# Patient Record
Sex: Female | Born: 1985 | Race: Black or African American | Hispanic: No | Marital: Single | Smoking: Former smoker
Health system: Southern US, Community
[De-identification: ages and names within clinical notes are randomized; demographics above are authoritative.]

---

## 2009-12-17 HISTORY — PX: TUBAL LIGATION: SHX77

## 2017-02-05 ENCOUNTER — Emergency Department (HOSPITAL_BASED_OUTPATIENT_CLINIC_OR_DEPARTMENT_OTHER): Payer: Self-pay

## 2017-02-05 ENCOUNTER — Emergency Department (HOSPITAL_BASED_OUTPATIENT_CLINIC_OR_DEPARTMENT_OTHER)
Admission: EM | Admit: 2017-02-05 | Discharge: 2017-02-05 | Disposition: A | Discharge status: Home / Self Care | Payer: Self-pay | Attending: Emergency Medicine | Admitting: Emergency Medicine

## 2017-02-05 ENCOUNTER — Encounter (HOSPITAL_BASED_OUTPATIENT_CLINIC_OR_DEPARTMENT_OTHER): Payer: Self-pay | Admitting: *Deleted

## 2017-02-05 DIAGNOSIS — R0789 Other chest pain: Secondary | ICD-10-CM | POA: Insufficient documentation

## 2017-02-05 DIAGNOSIS — R5383 Other fatigue: Secondary | ICD-10-CM | POA: Insufficient documentation

## 2017-02-05 DIAGNOSIS — R079 Chest pain, unspecified: Secondary | ICD-10-CM

## 2017-02-05 LAB — BASIC METABOLIC PANEL
ANION GAP: 5 (ref 5–15)
BUN: 7 mg/dL (ref 6–20)
CHLORIDE: 108 mmol/L (ref 101–111)
CO2: 25 mmol/L (ref 22–32)
Calcium: 8.5 mg/dL — ABNORMAL LOW (ref 8.9–10.3)
Creatinine, Ser: 0.71 mg/dL (ref 0.44–1.00)
GFR calc non Af Amer: >60 mL/min (ref >60)
Glucose, Bld: 76 mg/dL (ref 65–99)
POTASSIUM: 4.3 mmol/L (ref 3.5–5.1)
SODIUM: 138 mmol/L (ref 135–145)

## 2017-02-05 LAB — CBC WITH DIFFERENTIAL/PLATELET
BASOS PCT: 1 %
Basophils Absolute: 0 10*3/uL (ref 0.0–0.1)
EOS ABS: 0.3 10*3/uL (ref 0.0–0.7)
Eosinophils Relative: 9 %
HCT: 36.6 % (ref 36.0–46.0)
Hemoglobin: 12.4 g/dL (ref 12.0–15.0)
Lymphocytes Relative: 30 %
Lymphs Abs: 1.1 10*3/uL (ref 0.7–4.0)
MCH: 29.7 pg (ref 26.0–34.0)
MCHC: 33.9 g/dL (ref 30.0–36.0)
MCV: 87.6 fL (ref 78.0–100.0)
Monocytes Absolute: 0.5 10*3/uL (ref 0.1–1.0)
Monocytes Relative: 13 %
NEUTROS PCT: 47 %
Neutro Abs: 1.7 10*3/uL (ref 1.7–7.7)
Platelets: 293 10*3/uL (ref 150–400)
RBC: 4.18 MIL/uL (ref 3.87–5.11)
RDW: 13.7 % (ref 11.5–15.5)
WBC: 3.5 10*3/uL — AB (ref 4.0–10.5)

## 2017-02-05 LAB — TROPONIN I: Troponin I: <0.03 ng/mL (ref <0.03)

## 2017-02-05 LAB — PREGNANCY, URINE: PREG TEST UR: NEGATIVE

## 2017-02-05 MED ORDER — CYCLOBENZAPRINE HCL 10 MG PO TABS: 10.0000 mg | ORAL_TABLET | Freq: Three times a day (TID) | ORAL | 0 refills | Status: AC | PRN

## 2017-02-05 MED ORDER — KETOROLAC TROMETHAMINE 30 MG/ML IJ SOLN
15.0000 mg | Freq: Once | INTRAMUSCULAR | Status: AC
  Administered 2017-02-05: 15 mg via INTRAVENOUS

## 2017-02-05 MED ORDER — KETOROLAC TROMETHAMINE 60 MG/2ML IM SOLN
15.0000 mg | Freq: Once | INTRAMUSCULAR | Status: DC
  Filled 2017-02-05: qty 2

## 2017-02-05 MED ORDER — NAPROXEN 375 MG PO TABS: 375.0000 mg | ORAL_TABLET | Freq: Two times a day (BID) | ORAL | 0 refills | Status: AC | PRN

## 2017-02-05 NOTE — ED Provider Notes (Signed)
[facility]   CSN: [id number] Arrival date & time: 02/05/17  0801     History   Chief Complaint Chief Complaint  Patient presents with  . chest pain earlier    HPI Rita Welch is a 31 y.o. female.  HPI 31 year old female with no significant past medical history but a strong family history of early coronary disease who presents with chest pain. The patient states that approximately an hour prior to arrival, she was sitting at work when she experienced a sharp, stabbing, crampy-like sensation that shot from her right shoulder to her left shoulder and down her left arm. She was not lifting or moving anything heavy at the time. She had no associated shortness of breath or nausea. Since then, the pain has largely resolved though she does feel a mild cramp in her left arm. The pain is worse with movement and palpation. Denies any alleviating factors. No other medical complaints.  History reviewed. No pertinent past medical history.  There are no active problems to display for this patient.   History reviewed. No pertinent surgical history.  OB History    No data available       Home Medications    Prior to Admission medications   Medication Sig Start Date End Date Taking? Authorizing Provider  cyclobenzaprine (FLEXERIL) 10 MG tablet Take 1 tablet (10 mg total) by mouth 3 (three) times daily as needed for muscle spasms. 02/05/17   Shaune Pollack, MD  naproxen (NAPROSYN) 375 MG tablet Take 1 tablet (375 mg total) by mouth 2 (two) times daily as needed for moderate pain. 02/05/17 02/12/17  Shaune Pollack, MD    Family History History reviewed. No pertinent family history.  Social History Social History  Substance Use Topics  . Smoking status: Current Every Day Smoker  . Smokeless tobacco: Never Used  . Alcohol use Not on file     Allergies   Shellfish allergy   Review of Systems Review of Systems  Constitutional: Positive for fatigue. Negative  for chills and fever.  HENT: Negative for congestion, rhinorrhea and sore throat.   Eyes: Negative for visual disturbance.  Respiratory: Positive for chest tightness. Negative for cough, shortness of breath and wheezing.   Cardiovascular: Positive for chest pain. Negative for leg swelling.  Gastrointestinal: Negative for abdominal pain, diarrhea, nausea and vomiting.  Genitourinary: Negative for dysuria, flank pain, vaginal bleeding and vaginal discharge.  Musculoskeletal: Negative for neck pain.  Skin: Negative for rash.  Allergic/Immunologic: Negative for immunocompromised state.  Neurological: Negative for syncope and headaches.  Hematological: Does not bruise/bleed easily.  All other systems reviewed and are negative.    Physical Exam Updated Vital Signs BP 111/64 (BP Location: Left Arm)   Pulse 67   Temp 98.3 F (36.8 C) (Oral)   Resp 18   Ht 5\' 5"  (1.651 m)   Wt 163 lb (73.9 kg)   LMP 01/25/2017   SpO2 100%   BMI 27.12 kg/m   Physical Exam  Constitutional: She is oriented to person, place, and time. She appears well-developed and well-nourished. No distress.  HENT:  Head: Normocephalic and atraumatic.  Mouth/Throat: Oropharynx is clear and moist.  Eyes: Conjunctivae are normal.  Neck: Neck supple.  Cardiovascular: Normal rate, regular rhythm and normal heart sounds.  Exam reveals no friction rub.   No murmur heard. Pulmonary/Chest: Effort normal and breath sounds normal. No respiratory distress. She has no wheezes. She has no rales. She exhibits tenderness (mild across upper chest).  Abdominal: She exhibits no distension.  Musculoskeletal: She exhibits no edema.  Neurological: She is alert and oriented to person, place, and time. She exhibits normal muscle tone.  Skin: Skin is warm. Capillary refill takes less than 2 seconds.  Psychiatric: She has a normal mood and affect.  Nursing note and vitals reviewed.    ED Treatments / Results  Labs (all labs ordered  are listed, but only abnormal results are displayed) Labs Reviewed  CBC WITH DIFFERENTIAL/PLATELET - Abnormal; Notable for the following:       Result Value   WBC 3.5 (*)    All other components within normal limits  BASIC METABOLIC PANEL - Abnormal; Notable for the following:    Calcium 8.5 (*)    All other components within normal limits  PREGNANCY, URINE  TROPONIN I  TROPONIN I    EKG  EKG Interpretation  Date/Time:  Tuesday February 05 2017 08:14:13 EST Ventricular Rate:  79 PR Interval:  174 QRS Duration: 78 QT Interval:  374 QTC Calculation: 428 R Axis:   85 Text Interpretation:  Normal sinus rhythm Normal ECG No old tracing to compare Confirmed by Miral Hoopes MD, Brinnley Lacap [phone]) on 02/05/2017 4:06:39 PM       Radiology Dg Chest 2 View  Result Date: 02/05/2017 CLINICAL DATA:  Onset of chest pain today.  Current smoker. EXAM: CHEST  2 VIEW COMPARISON:  None in PACs FINDINGS: The lungs are mildly hyperinflated. There is no pneumothorax, pneumomediastinum, or pleural effusion. The heart and pulmonary vascularity are normal. The mediastinum is normal in width. The trachea is midline. The bony thorax exhibits no acute abnormality. IMPRESSION: Mild hyperinflation consistent with acute bronchitis and/or the patient's smoking history. No pneumonia nor pneumothorax nor other acute cardiopulmonary abnormality. Electronically Signed   By: David  [place] M.D.   On: 02/05/2017 09:03    Procedures Procedures (including critical care time)  Medications Ordered in ED Medications  ketorolac (TORADOL) 30 MG/ML injection 15 mg (15 mg Intravenous Given 02/05/17 1017)     Initial Impression / Assessment and Plan / ED Course  I have reviewed the triage vital signs and the nursing notes.  Pertinent labs & imaging results that were available during my care of the patient were reviewed by me and considered in my medical decision making (see chart for details).     31 year old female with a  strong family history of coronary disease here with highly atypical chest pain. Suspect this was secondary to muscular spasm and possible intercostal sprain. She does admit to recently moving which could have exacerbated or caused her symptoms. Otherwise, she does have a strong family history but otherwise has no pertinent past medical history and is a heart score of less than 3. Delta troponin is negative and I do not suspect acute ACS at this time. Otherwise, chest x-ray is clear without evidence of pneumonia. She has no tachycardia, tachypnea, hypoxia, and I do not suspect pulmonary embolism. She is PERC negative. Will discharge home with outpatient follow-up.  Final Clinical Impressions(s) / ED Diagnoses   Final diagnoses:  Chest pain  Chest wall pain    New Prescriptions Discharge Medication List as of 02/05/2017  1:20 PM    START taking these medications   Details  cyclobenzaprine (FLEXERIL) 10 MG tablet Take 1 tablet (10 mg total) by mouth 3 (three) times daily as needed for muscle spasms., Starting Tue 02/05/2017, Print    naproxen (NAPROSYN) 375 MG tablet Take 1 tablet (375 mg total) by  mouth 2 (two) times daily as needed for moderate pain., Starting Tue 02/05/2017, Until Tue 02/12/2017, Print         Shaune Pollackameron Jakevion Arney, MD 02/05/17 [phone] [id number]

## 2017-02-05 NOTE — ED Triage Notes (Signed)
Pt reports chest "spasm" while at work this morning lasting "seconds or minutes, I"m not sure." then resolved. Pt states it went from one arm across chest and down other arm. Pt states her left arm "still feels funny."

## 2018-02-20 ENCOUNTER — Emergency Department (HOSPITAL_BASED_OUTPATIENT_CLINIC_OR_DEPARTMENT_OTHER)
Admission: EM | Admit: 2018-02-20 | Discharge: 2018-02-20 | Disposition: A | Discharge status: Home / Self Care | Payer: Managed Care, Other (non HMO) | Attending: Emergency Medicine | Admitting: Emergency Medicine

## 2018-02-20 ENCOUNTER — Encounter (HOSPITAL_BASED_OUTPATIENT_CLINIC_OR_DEPARTMENT_OTHER): Payer: Self-pay

## 2018-02-20 DIAGNOSIS — R110 Nausea: Secondary | ICD-10-CM | POA: Insufficient documentation

## 2018-02-20 DIAGNOSIS — H53149 Visual discomfort, unspecified: Secondary | ICD-10-CM | POA: Insufficient documentation

## 2018-02-20 DIAGNOSIS — R51 Headache: Secondary | ICD-10-CM | POA: Diagnosis not present

## 2018-02-20 DIAGNOSIS — R519 Headache, unspecified: Secondary | ICD-10-CM

## 2018-02-20 DIAGNOSIS — F17200 Nicotine dependence, unspecified, uncomplicated: Secondary | ICD-10-CM | POA: Insufficient documentation

## 2018-02-20 MED ORDER — KETOROLAC TROMETHAMINE 15 MG/ML IJ SOLN
15.0000 mg | Freq: Once | INTRAMUSCULAR | Status: AC
  Administered 2018-02-20: 15 mg via INTRAVENOUS
  Filled 2018-02-20: qty 1

## 2018-02-20 MED ORDER — DIPHENHYDRAMINE HCL 50 MG/ML IJ SOLN
25.0000 mg | Freq: Once | INTRAMUSCULAR | Status: AC
  Administered 2018-02-20: 25 mg via INTRAVENOUS
  Filled 2018-02-20: qty 1

## 2018-02-20 MED ORDER — PROCHLORPERAZINE EDISYLATE 5 MG/ML IJ SOLN
10.0000 mg | Freq: Once | INTRAMUSCULAR | Status: AC
  Administered 2018-02-20: 10 mg via INTRAVENOUS
  Filled 2018-02-20: qty 2

## 2018-02-20 MED ORDER — SODIUM CHLORIDE 0.9 % IV BOLUS (SEPSIS)
1000.0000 mL | Freq: Once | INTRAVENOUS | Status: AC
  Administered 2018-02-20: 1000 mL via INTRAVENOUS

## 2018-02-20 NOTE — ED Triage Notes (Signed)
Pt c/o headache, dizziness, nausea and vomiting since 0730, states feels like sinus pressure but sensitive to light

## 2018-02-27 NOTE — ED Provider Notes (Signed)
[facility] EMERGENCY DEPARTMENT Provider Note   CSN: [id number] Arrival date & time: 02/20/18  4098     History   Chief Complaint Chief Complaint  Patient presents with  . Headache    HPI Rita Welch is a 32 y.o. female.  HPI   32 year old female with headache.  Pain is in the left face to left temporal region.  Symptom onset early this morning.  Persistent since then.  Denies any trauma.  Associated with photophobia.  No fevers or chills.  Nausea, no vomiting.  No change in visual acuity.  History reviewed. No pertinent past medical history.  There are no active problems to display for this patient.   History reviewed. No pertinent surgical history.  OB History    No data available       Home Medications    Prior to Admission medications   Medication Sig Start Date End Date Taking? Authorizing Provider  cyclobenzaprine (FLEXERIL) 10 MG tablet Take 1 tablet (10 mg total) by mouth 3 (three) times daily as needed for muscle spasms. 02/05/17   Shaune Pollack, MD    Family History No family history on file.  Social History Social History   Tobacco Use  . Smoking status: Current Every Day Smoker  . Smokeless tobacco: Never Used  Substance Use Topics  . Alcohol use: No    Frequency: Never  . Drug use: No     Allergies   Shellfish allergy   Review of Systems Review of Systems  All systems reviewed and negative, other than as noted in HPI.  Physical Exam Updated Vital Signs BP 120/79 (BP Location: Left Arm)   Pulse 80   Temp 97.9 F (36.6 C) (Oral)   Resp 18   Ht 5\' 5"  (1.651 m)   Wt 74.8 kg (165 lb)   LMP 02/14/2018   SpO2 100%   BMI 27.46 kg/m   Physical Exam  Constitutional: She is oriented to person, place, and time. She appears well-developed and well-nourished. No distress.  HENT:  Head: Normocephalic and atraumatic.  Eyes: Conjunctivae are normal. Right eye exhibits no discharge. Left eye exhibits no discharge.  Neck:  Neck supple.  No nuchal rigidity  Cardiovascular: Normal rate, regular rhythm and normal heart sounds. Exam reveals no gallop and no friction rub.  No murmur heard. Pulmonary/Chest: Effort normal and breath sounds normal. No respiratory distress.  Abdominal: Soft. She exhibits no distension. There is no tenderness.  Musculoskeletal: She exhibits no edema or tenderness.  Neurological: She is alert and oriented to person, place, and time. No cranial nerve deficit or sensory deficit. She exhibits normal muscle tone. Coordination normal.  Skin: Skin is warm and dry.  Psychiatric: She has a normal mood and affect. Her behavior is normal. Thought content normal.  Nursing note and vitals reviewed.    ED Treatments / Results  Labs (all labs ordered are listed, but only abnormal results are displayed) Labs Reviewed - No data to display  EKG  EKG Interpretation None       Radiology No results found.  Procedures Procedures (including critical care time)  Medications Ordered in ED Medications  sodium chloride 0.9 % bolus 1,000 mL (0 mLs Intravenous Stopped 02/20/18 1140)  ketorolac (TORADOL) 15 MG/ML injection 15 mg (15 mg Intravenous Given 02/20/18 1011)  prochlorperazine (COMPAZINE) injection 10 mg (10 mg Intravenous Given 02/20/18 1012)  diphenhydrAMINE (BENADRYL) injection 25 mg (25 mg Intravenous Given 02/20/18 1010)     Initial Impression / Assessment  and Plan / ED Course  I have reviewed the triage vital signs and the nursing notes.  Pertinent labs & imaging results that were available during my care of the patient were reviewed by me and considered in my medical decision making (see chart for details).     32 year old female with headache.  Nonfocal neuro exam.  Afebrile.  No nuchal rigidity.  No particular concerning red flags.  Treated symptomatically with improvement. Doubt meningitis, SAH, venous thrombosis, etc.   Final Clinical Impressions(s) / ED Diagnoses   Final  diagnoses:  Nonintractable headache, unspecified chronicity pattern, unspecified headache type    ED Discharge Orders    None       Raeford RazorKohut, Satya Bohall, MD 02/27/18 1455

## 2018-03-26 IMAGING — CR DG CHEST 2V
2 series · 2 of 2 positions shown · non-contrast
Comparison: None in PACs

CLINICAL DATA: Onset of chest pain today.  Current smoker.

EXAM:
CHEST  2 VIEW

[w chest pa]
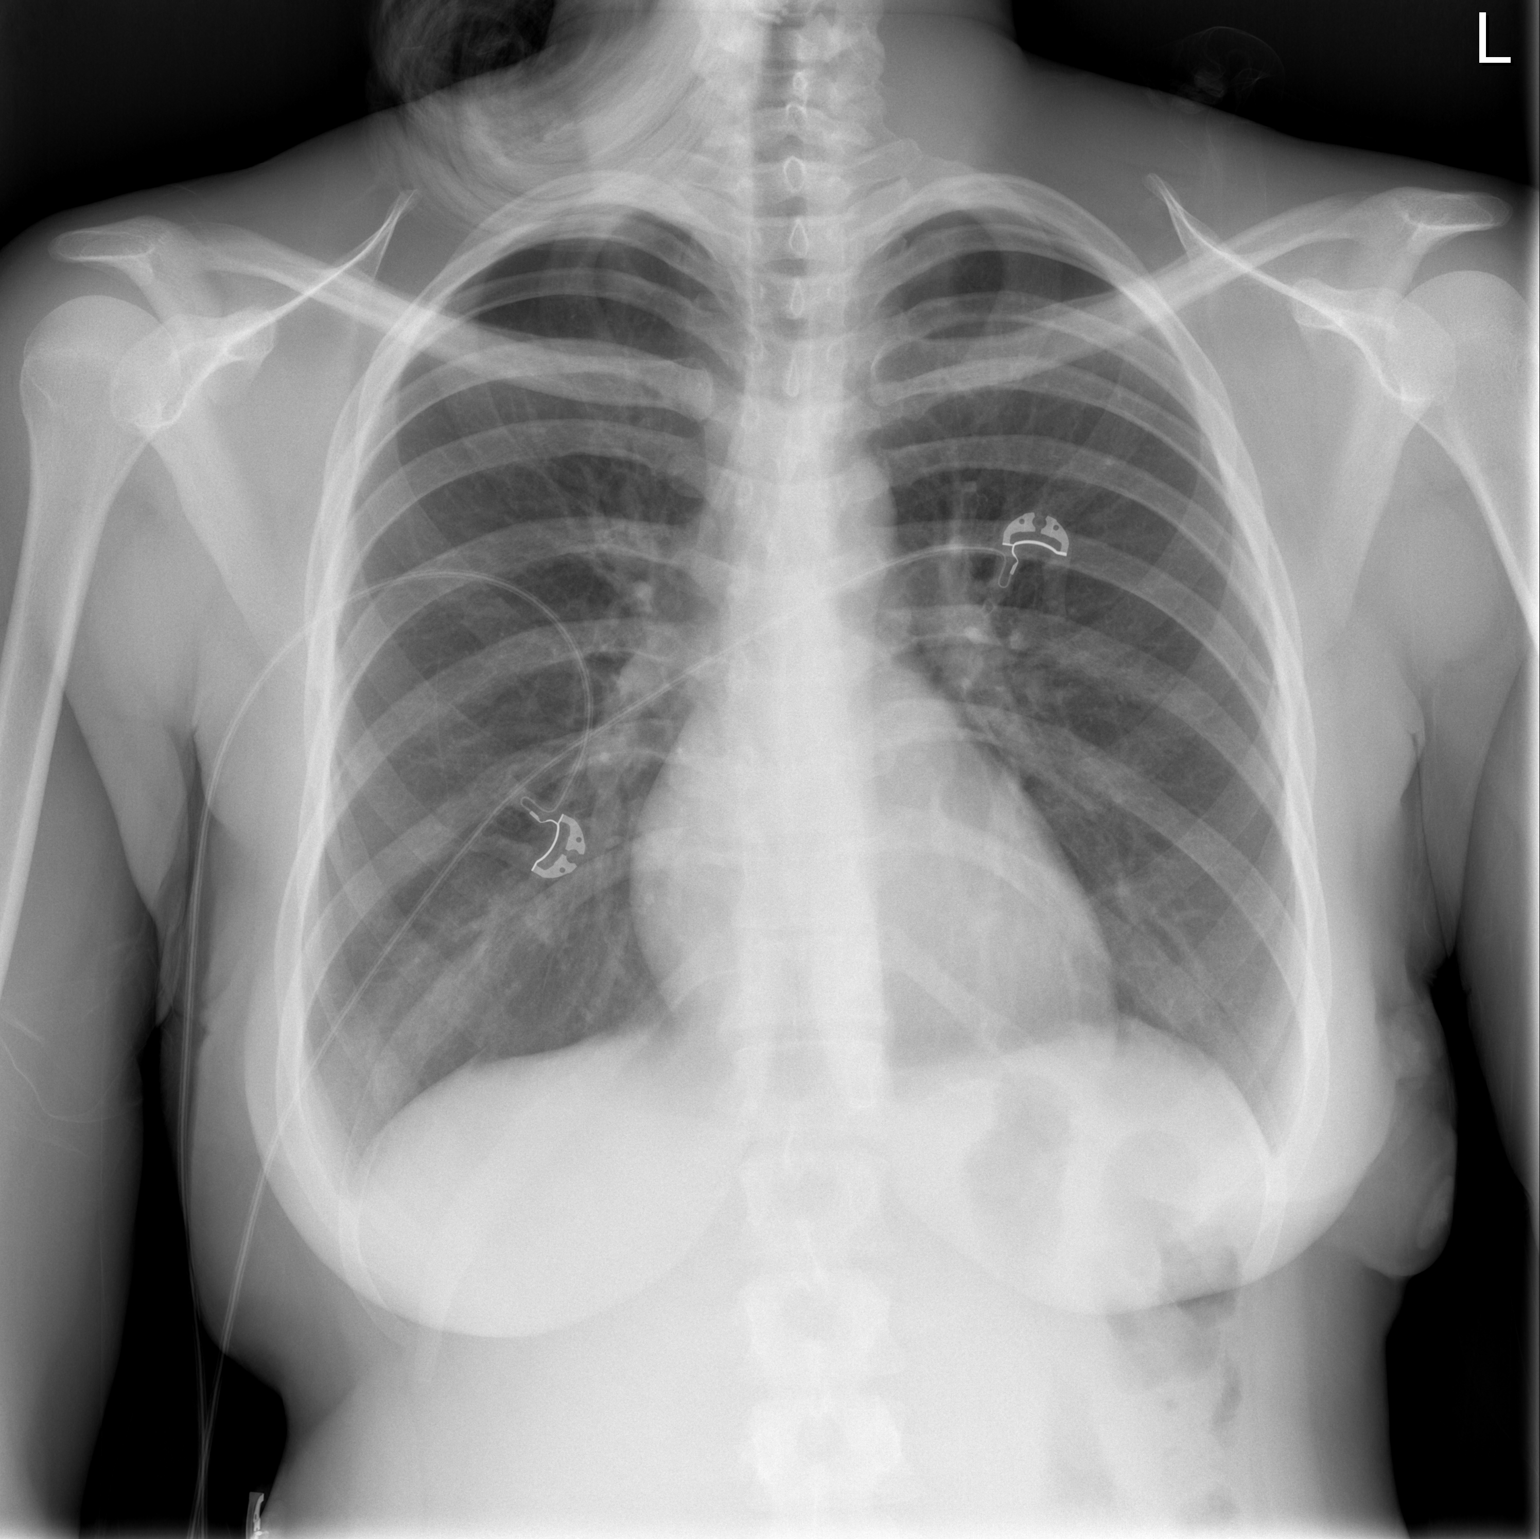

[w chest lat]
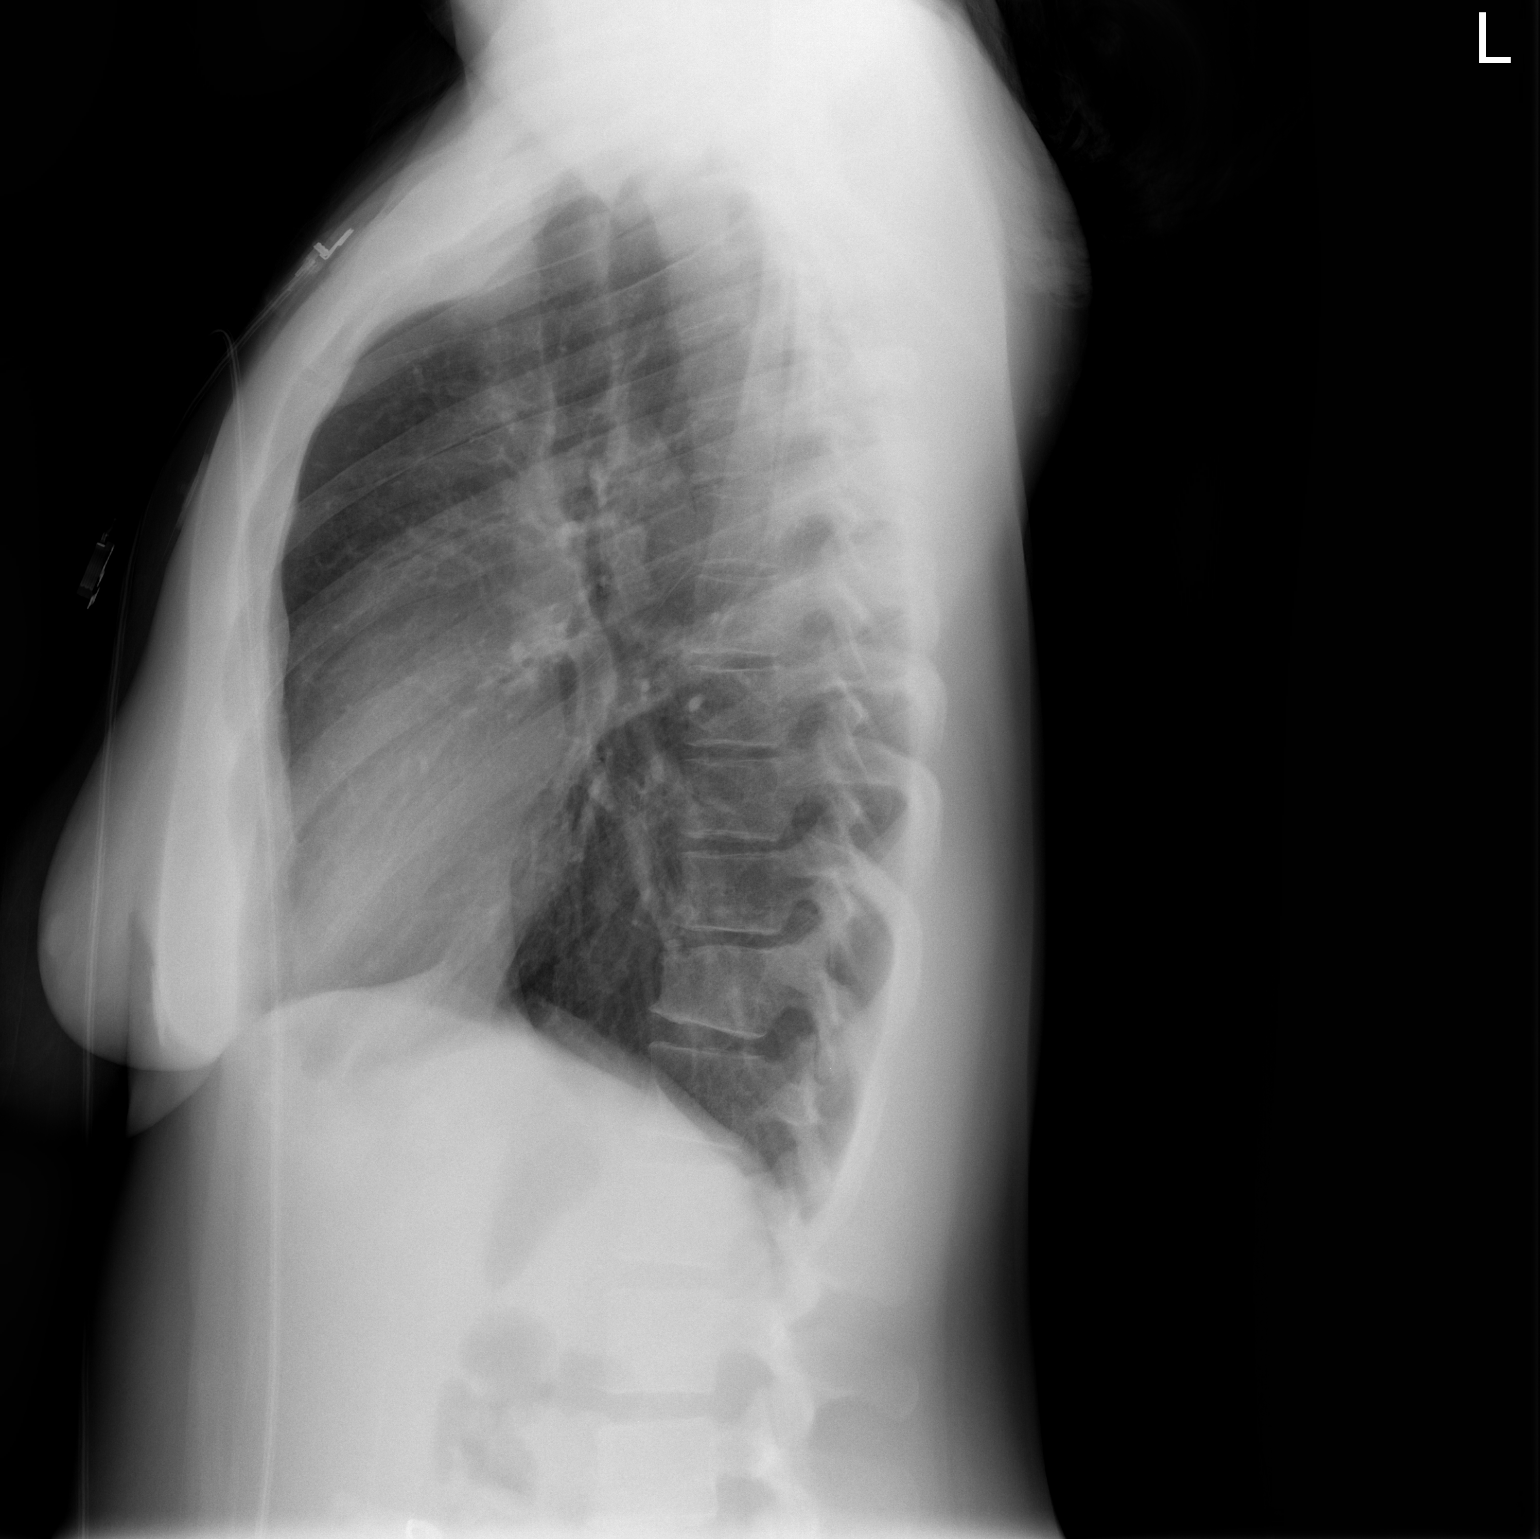

[2 of 2 positions shown; findings below may reference images not displayed]

FINDINGS: The lungs are mildly hyperinflated. There is no pneumothorax,
pneumomediastinum, or pleural effusion. The heart and pulmonary
vascularity are normal. The mediastinum is normal in width. The
trachea is midline. The bony thorax exhibits no acute abnormality.
IMPRESSION: Mild hyperinflation consistent with acute bronchitis and/or the
patient's smoking history. No pneumonia nor pneumothorax nor other
acute cardiopulmonary abnormality.

## 2020-11-25 ENCOUNTER — Other Ambulatory Visit (HOSPITAL_COMMUNITY)
Admission: RE | Admit: 2020-11-25 | Discharge: 2020-11-25 | Disposition: A | Discharge status: Home / Self Care | Payer: BC Managed Care – PPO | Source: Ambulatory Visit | Attending: Family Medicine | Admitting: Family Medicine

## 2020-11-25 ENCOUNTER — Ambulatory Visit (INDEPENDENT_AMBULATORY_CARE_PROVIDER_SITE_OTHER): Payer: BC Managed Care – PPO | Admitting: Family Medicine

## 2020-11-25 ENCOUNTER — Encounter: Payer: Self-pay | Admitting: Family Medicine

## 2020-11-25 ENCOUNTER — Other Ambulatory Visit: Payer: Self-pay

## 2020-11-25 VITALS — BP 121/75 | HR 69 | Ht 66.0 in | Wt 157.0 lb

## 2020-11-25 DIAGNOSIS — Z01419 Encounter for gynecological examination (general) (routine) without abnormal findings: Secondary | ICD-10-CM

## 2020-11-25 NOTE — Progress Notes (Signed)
GYNECOLOGY ANNUAL PREVENTATIVE CARE ENCOUNTER NOTE  Subjective:   Camden Mazzaferro is a 34 y.o. G21P2002 female here for a routine annual gynecologic exam.  Current complaints: none.   Denies abnormal vaginal bleeding, discharge, pelvic pain, problems with intercourse or other gynecologic concerns.    No family history of breast, uterine, ovarian cancer  Gynecologic History Patient's last menstrual period was 10/30/2020. Patient is sexually active  Contraception: tubal ligation - [facility] Last Pap: 3 years ago. Results were: normal Last mammogram: n/a.   Obstetric History OB History  Gravida Para Term Preterm AB Living  2 2 2     2   SAB IAB Ectopic Multiple Live Births          2    # Outcome Date GA Lbr Len/2nd Weight Sex Delivery Anes PTL Lv  2 Term 2010 [redacted]w[redacted]d   F Vag-Spont None N LIV  1 Term 2009 [redacted]w[redacted]d   M Vag-Spont EPI N LIV    History reviewed. No pertinent past medical history.  History reviewed. No pertinent surgical history.  Current Outpatient Medications on File Prior to Visit  Medication Sig Dispense Refill  . cyclobenzaprine (FLEXERIL) 10 MG tablet Take 1 tablet (10 mg total) by mouth 3 (three) times daily as needed for muscle spasms. (Patient not taking: Reported on 11/25/2020) 20 tablet 0   No current facility-administered medications on file prior to visit.    Allergies  Allergen Reactions  . Shellfish Allergy     Social History   Socioeconomic History  . Marital status: Single    Spouse name: Not on file  . Number of children: Not on file  . Years of education: Not on file  . Highest education level: Not on file  Occupational History  . Not on file  Tobacco Use  . Smoking status: Current Every Day Smoker  . Smokeless tobacco: Never Used  Substance and Sexual Activity  . Alcohol use: No  . Drug use: No  . Sexual activity: Yes    Comment: [facility]   Other Topics Concern  . Not on file  Social History Narrative  . Not on file   Social  Determinants of Health   Financial Resource Strain: Not on file  Food Insecurity: Not on file  Transportation Needs: Not on file  Physical Activity: Not on file  Stress: Not on file  Social Connections: Not on file  Intimate Partner Violence: Not on file    Family History  Problem Relation Age of Onset  . Diabetes Father   . Hypertension Father   . High Cholesterol Father     The following portions of the patient's history were reviewed and updated as appropriate: allergies, current medications, past family history, past medical history, past social history, past surgical history and problem list.  Review of Systems Pertinent items are noted in HPI.   Objective:  BP 121/75   Pulse 69   Ht 5\' 6"  (1.676 m)   Wt 157 lb (71.2 kg)   LMP 10/30/2020   BMI 25.34 kg/m  Wt Readings from Last 3 Encounters:  11/25/20 157 lb (71.2 kg)  02/20/18 165 lb (74.8 kg)  02/05/17 163 lb (73.9 kg)     Chaperone present during exam  CONSTITUTIONAL: Well-developed, well-nourished female in no acute distress.  HENT:  Normocephalic, atraumatic, External right and left ear normal. Oropharynx is clear and moist EYES: Conjunctivae and EOM are normal. Pupils are equal, round, and reactive to light. No scleral icterus.  NECK:  Normal range of motion, supple, no masses.  Normal thyroid.   CARDIOVASCULAR: Normal heart rate noted, regular rhythm RESPIRATORY: Clear to auscultation bilaterally. Effort and breath sounds normal, no problems with respiration noted. BREASTS: Symmetric in size. No masses, skin changes, nipple drainage, or lymphadenopathy. ABDOMEN: Soft, normal bowel sounds, no distention noted.  No tenderness, rebound or guarding.  PELVIC: Normal appearing external genitalia; normal appearing vaginal mucosa and cervix.  No abnormal discharge noted.  Normal uterine size, no other palpable masses, no uterine or adnexal tenderness. MUSCULOSKELETAL: Normal range of motion. No tenderness.  No  cyanosis, clubbing, or edema.  2+ distal pulses. SKIN: Skin is warm and dry. No rash noted. Not diaphoretic. No erythema. No pallor. NEUROLOGIC: Alert and oriented to person, place, and time. Normal reflexes, muscle tone coordination. No cranial nerve deficit noted. PSYCHIATRIC: Normal mood and affect. Normal behavior. Normal judgment and thought content.  Assessment:  Annual gynecologic examination with pap smear   Plan:  1. Well Woman Exam Will follow up results of pap smear and manage accordingly. STD testing discussed. Patient requested testing - Cytology - PAP( [facility]) - Hepatitis C Antibody - Hepatitis B Surface AntiGEN - HIV antibody (with reflex) - RPR   Routine preventative health maintenance measures emphasized. Please refer to After Visit Summary for other counseling recommendations.    Candelaria Celeste, DO [facility]

## 2020-11-26 LAB — HEPATITIS C ANTIBODY: Hep C Virus Ab: <0.1 s/co ratio (ref 0.0–0.9)

## 2020-11-26 LAB — HIV ANTIBODY (ROUTINE TESTING W REFLEX): HIV Screen 4th Generation wRfx: NONREACTIVE

## 2020-11-26 LAB — HEPATITIS B SURFACE ANTIGEN: Hepatitis B Surface Ag: NEGATIVE

## 2020-11-26 LAB — RPR: RPR Ser Ql: NONREACTIVE

## 2020-11-28 LAB — CYTOLOGY - PAP
Chlamydia: NEGATIVE
Comment: NEGATIVE
Comment: NEGATIVE
Comment: NORMAL
Diagnosis: NEGATIVE
High risk HPV: NEGATIVE
Neisseria Gonorrhea: NEGATIVE

## 2021-07-10 DIAGNOSIS — L309 Dermatitis, unspecified: Secondary | ICD-10-CM | POA: Diagnosis not present

## 2021-07-10 DIAGNOSIS — F331 Major depressive disorder, recurrent, moderate: Secondary | ICD-10-CM | POA: Diagnosis not present

## 2021-07-10 DIAGNOSIS — F419 Anxiety disorder, unspecified: Secondary | ICD-10-CM | POA: Diagnosis not present

## 2022-04-06 ENCOUNTER — Ambulatory Visit: Payer: BC Managed Care – PPO | Admitting: Family Medicine

## 2023-05-06 ENCOUNTER — Other Ambulatory Visit: Payer: Self-pay

## 2023-05-06 ENCOUNTER — Emergency Department (HOSPITAL_BASED_OUTPATIENT_CLINIC_OR_DEPARTMENT_OTHER)
Admission: EM | Admit: 2023-05-06 | Discharge: 2023-05-06 | Disposition: A | Discharge status: Home / Self Care | Payer: BC Managed Care – PPO | Attending: Emergency Medicine | Admitting: Emergency Medicine

## 2023-05-06 ENCOUNTER — Emergency Department (HOSPITAL_BASED_OUTPATIENT_CLINIC_OR_DEPARTMENT_OTHER): Payer: BC Managed Care – PPO

## 2023-05-06 ENCOUNTER — Encounter (HOSPITAL_BASED_OUTPATIENT_CLINIC_OR_DEPARTMENT_OTHER): Payer: Self-pay | Admitting: Emergency Medicine

## 2023-05-06 DIAGNOSIS — Y92410 Unspecified street and highway as the place of occurrence of the external cause: Secondary | ICD-10-CM | POA: Diagnosis not present

## 2023-05-06 DIAGNOSIS — M25552 Pain in left hip: Secondary | ICD-10-CM | POA: Diagnosis present

## 2023-05-06 LAB — PREGNANCY, URINE: Preg Test, Ur: NEGATIVE

## 2023-05-06 MED ORDER — CYCLOBENZAPRINE HCL 10 MG PO TABS: 10.0000 mg | ORAL_TABLET | Freq: Two times a day (BID) | ORAL | 0 refills | Status: AC | PRN

## 2023-05-06 NOTE — ED Notes (Signed)
Pt A&Ox4 ambulatory at d/c with independent steady gait. Pt verbalized understanding of d/c instructions, prescription and follow up care. 
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
## 2023-05-06 NOTE — ED Notes (Signed)
Pt has returned from xray
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
